# Patient Record
Sex: Male | Born: 1945 | Race: White | Marital: Married
Health system: Southern US, Community
[De-identification: ages and names within clinical notes are randomized; demographics above are authoritative.]

---

## 2011-04-22 ENCOUNTER — Other Ambulatory Visit: Payer: Self-pay | Admitting: Pain Medicine

## 2011-04-22 DIAGNOSIS — M545 Low back pain: Secondary | ICD-10-CM

## 2011-05-08 ENCOUNTER — Ambulatory Visit
Admission: RE | Admit: 2011-05-08 | Discharge: 2011-05-08 | Disposition: A | Discharge status: Home / Self Care | Payer: Medicare Other | Source: Ambulatory Visit | Attending: Pain Medicine | Admitting: Pain Medicine

## 2011-05-08 VITALS — BP 150/74 | HR 62 | Ht 71.0 in | Wt 228.0 lb

## 2011-05-08 DIAGNOSIS — M545 Low back pain, unspecified: Secondary | ICD-10-CM

## 2011-05-08 MED ORDER — MEPERIDINE HCL 100 MG/ML IJ SOLN
75.0000 mg | Freq: Once | INTRAMUSCULAR | Status: AC
  Administered 2011-05-08: 75 mg via INTRAMUSCULAR

## 2011-05-08 MED ORDER — ONDANSETRON HCL 4 MG/2ML IJ SOLN
4.0000 mg | Freq: Once | INTRAMUSCULAR | Status: AC
  Administered 2011-05-08: 4 mg via INTRAMUSCULAR

## 2011-05-08 MED ORDER — IOHEXOL 300 MG/ML  SOLN
10.0000 mL | Freq: Once | INTRAMUSCULAR | Status: AC | PRN
  Administered 2011-05-08: 10 mL via INTRATHECAL

## 2011-05-08 MED ORDER — DIAZEPAM 5 MG PO TABS
5.0000 mg | ORAL_TABLET | Freq: Once | ORAL | Status: AC
  Administered 2011-05-08: 5 mg via ORAL

## 2011-05-08 NOTE — Discharge Instructions (Signed)
Myelogram and Lumbar Puncture Discharge Instructions  1. Go home and rest quietly for the next 24 hours.  It is important to lie flat for the next 24 hours.  Get up only to go to the restroom.  You may lie in the bed or on a couch on your back, your stomach, your left side or your right side.  You may have one pillow under your head.  You may have pillows between your knees while you are on your side or under your knees while you are on your back.  2. DO NOT drive today.  Recline the seat as far back as it will go, while still wearing your seat belt, on the way home.  3. You may get up to go to the bathroom as needed.  You may sit up for 10 minutes to eat.  You may resume your normal diet and medications unless otherwise indicated.  Drink lots of extra fluids today and tomorrow.  4. The incidence of headache, nausea, or vomiting is about 5% (one in 20 patients).  If you develop a headache, lie flat and drink plenty of fluids until the headache goes away.  Caffeinated beverages may be helpful.  If you develop severe nausea and vomiting or a headache that does not go away with flat bed rest, call [phone].  5. You may resume normal activities after your 24 hours of bed rest is over; however, do not exert yourself strongly or do any heavy lifting tomorrow.  6. Call your physician for a follow-up appointment.   7. If you have any questions or if complications develop after you arrive home, please call [phone].  Discharge instructions have been explained to the patient.  The patient, or the person responsible for the patient, fully understands these instructions.

## 2011-05-08 NOTE — Progress Notes (Signed)
Wife at bedside.  Patient resting quietly on back on stretcher without complaint.  Had experienced some nausea after being medicated for pain while rolling over on stretcher or sitting up from procedure table, but states that has passed now.  Denies pain, also.  jkl

## 2012-10-06 IMAGING — CT CT T SPINE W/ CM
2 of 16 series · 7 of 33 positions shown, 8 images · non-contrast
Comparison: Outside MRI.

MYELOGRAM  INJECTION
TECHNIQUE: Informed consent was obtained from the patient prior to
the procedure, including potential complications of headache,
allergy, infection and pain. Specific instructions were given
regarding 24 hour bedrest post procedure to prevent post-LP
headache.  A timeout procedure was performed.  With the patient
prone, the lower back was prepped with Betadine.  1% Lidocaine was
used for local anesthesia.  Lumbar puncture was performed by the
radiologist at the  L3  level using a 22 gauge needle with return
of clear CSF.  10 ml of Umnipaque-QEE was injected into the
subarachnoid space .
CLINICAL DATA: Low back pain.  Mid back pain.  Previous lumbar
fusion using Xstop devices. Subsequent dorsal column stimulator for
failed back syndrome.
TECHNIQUE: Multidetector CT imaging of the lumbar spine was
performed following myelography.  Multiplanar CT image
reconstructions were also generated.
TECHNIQUE: Multidetector CT imaging of the thoracic spine was

[Series 2: t spine bone · axial · 0.35mm/px · z∈[-408,-71]mm · 2 of 136 slices shown, 3 images]
[im 1/136  soft-tissue]
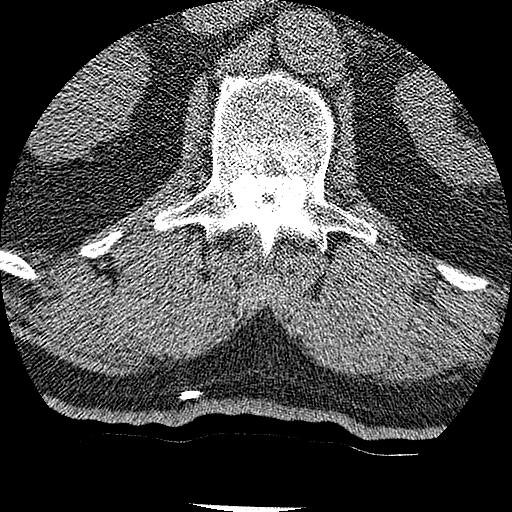
[im 1/136  bone]
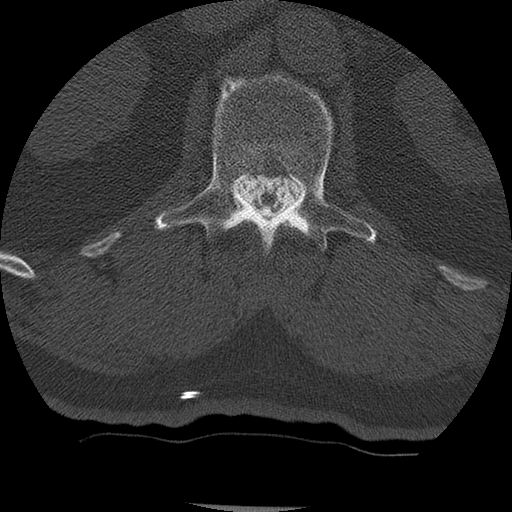
[im 136/136  bone]
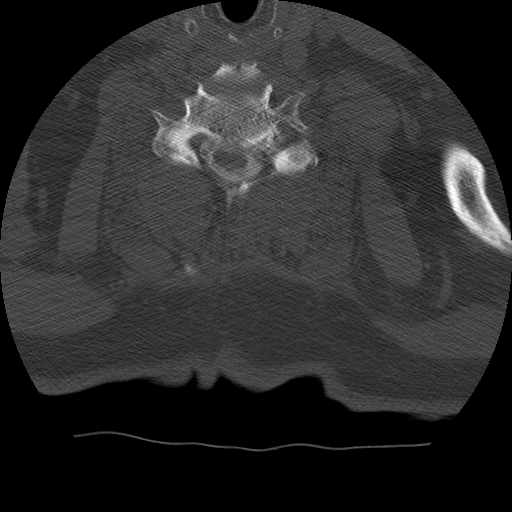

[Series 402: sag t-sp · sagittal · 0.67mm/px · 5 of 56 slices shown]
[im 10/56  bone]
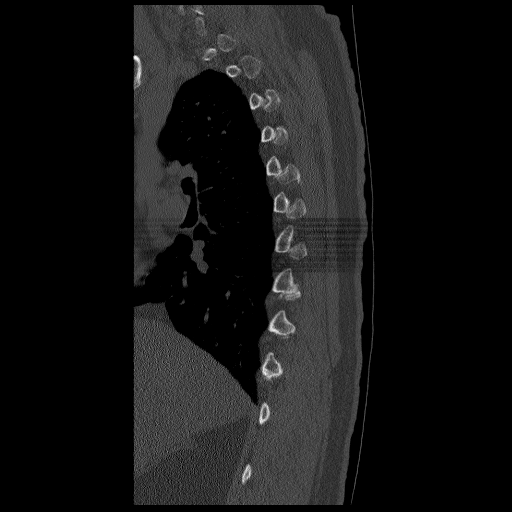
[im 19/56  bone]
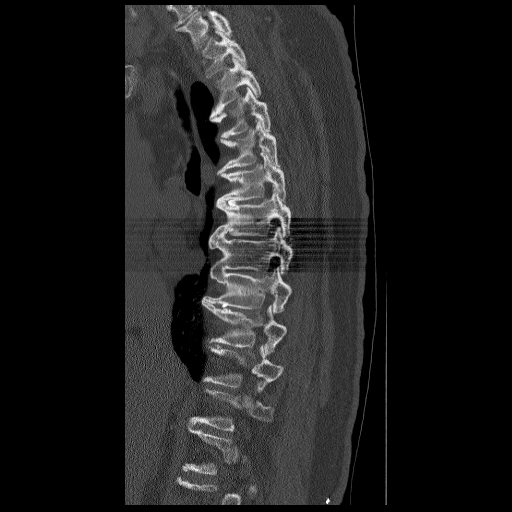
[im 28/56  bone]
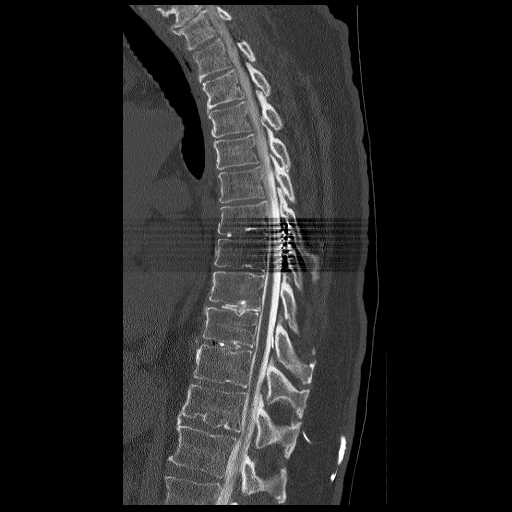
[im 37/56  bone]
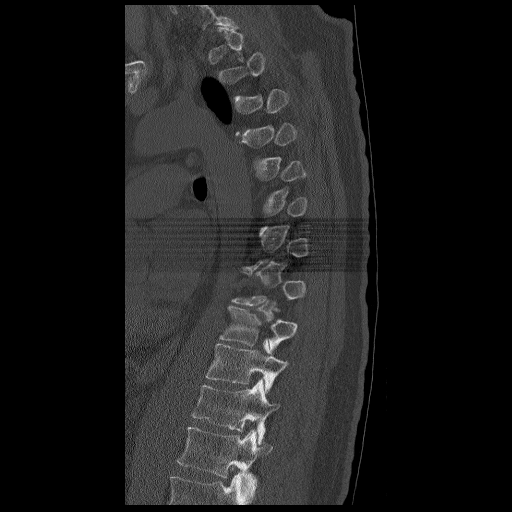
[im 46/56  bone]
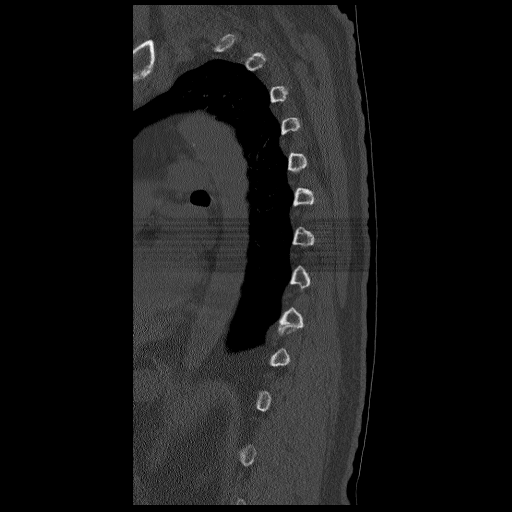

[7 of 33 positions shown; findings below may reference images not displayed]

IMPRESSION: Successful injection of  intrathecal contrast for myelography.

MYELOGRAM LUMBAR
FINDINGS: There is good opacification of the lumbar subarachnoid
space.Prior XStop Devices have been placed across the L3-4 and L4-5
segments. There is advanced disc space narrowing at L5-S1.  There
is severe stenosis at the L2-3, L3-4, L4-5, and L5-S1 levels. With
the patient standing in neutral and extension, there is anatomic
alignment.  In flexion, there is 8 mm anterolisthesis of L4 forward
on L5.  There is no significant movement at L3-4.

Fluoroscopy Time: 2.26 minutes
IMPRESSION: As above

CT MYELOGRAPHY LUMBAR SPINE
FINDINGS: No prevertebral or paraspinous masses.

L1-2:  Mild facet arthropathy.  No stenosis or disc protrusion.

L2-3: Multifactorial spinal stenosis is moderate to severe with
shallow central protrusion, short pedicles, and posterior element
hypertrophy.  Bilateral L3 nerve root encroachment is present,
slightly worse on the right.

L3-4:  The XStop device is malpositioned, with a gap between arms
which extends superiorly and inferiorly on the right with respect
to the spinous processes.  The leftward wings appear to have eroded
the cortex slightly, but are closely applied to the bone.  There is
severe spinal stenosis at this level with short pedicles, posterior
element hypertrophy and central bulging annular fibers affecting
the L4 nerve roots.  Bilateral foraminal narrowing compresses the
exiting L3 nerve roots.

L4-5: The XStop device is malpositioned in a similar fashion as the
one above, with the superior and inferiorly projecting arms on the
right not closely applied to the bone. The inferiorly projecting
left arm appears to have eroded the cortex of the left L5 lamina
slightly.  There is severe spinal stenosis at this level secondary
to short pedicles, central disc protrusion and posterior element
hypertrophy.  Neural foraminal narrowing is seen bilaterally, worse
on the left.  Bilateral L4 and L5 nerve root encroachment are
present.

L5-S1: Severe disc space narrowing.  Central disc protrusion is
partially calcified.  There is advanced facet arthropathy.
Bilateral L5 and S1 nerve root approach are present.
IMPRESSION: Multilevel spondylosis as described.  8 mm dynamic instability at
L4-L5.  Severe multifactorial spinal stenosis at multiple levels
from L2-S1.  Malposition of both XStop devices without secure
attachment to the posterior elements.

MYELOGRAM THORACIC
FINDINGS: Good opacification of the thoracic subarachnoid space.
Dorsal column stimulator enters posteriorly at T9-T10 and extends
upward with the array centered in the dorsal epidural space between
the T6 and T7 vertebral body segments. The wires appear intact, and
the  battery pack is situated in the right paralumbar region.
There is no cord compression, spinal stenosis, or significant
extradural defect.  Scattered Schmorl's nodes can be seen in the
lower thoracic disc spaces.  Anterior bridging osteophytes are
prominent throughout the mid to lower thoracic spine.
IMPRESSION: As above.

CT MYELOGRAPHY THORACIC SPINE
The individual disc spaces are examined as follows:

 T1-2: Normal.

T2-3:  Normal.

T3-4:  Tiny calcific protrusion centrally is non compressive.

T4-5:  Tiny calcific protrusion centrally is non compressive.

T5-6:  Normal.

T6-7:  Tiny central protrusion is non compressive.  Streak artifact
from the dorsal column stimulator. There is no compression of the
underlying cord by the electrodes.

T7-8:  Mild bulge.  Streak artifact. There is no compression of the
underlying cord bony electrodes.

T8-9:  The disc is calcified centrally with Schmorl's node
projecting superiorly and inferiorly.  There is a tiny left
paracentral posterior protrusion which is non compressive.

T9-10:  The disc is calcified centrally with Schmorl's node
formation.  There is a shallow posterior protrusion which is non
compressive. Wires exit posteriorly at this level.

T10-11:  Normal interspace.

T11-12:  Schmorl's nodes.  No posterior disc protrusion.

T12-L1: Mild bulge.
IMPRESSION: Minor thoracic disc pathology without significant disc protrusion
or spinal stenosis.

Dorsal column stimulator appears well positioned with the electrode
array opposite the T6 and T7 vertebral body segments.

## 2021-10-06 ENCOUNTER — Institutional Professional Consult (permissible substitution): Payer: Medicare Other | Admitting: Plastic Surgery

## 2021-10-08 ENCOUNTER — Institutional Professional Consult (permissible substitution): Payer: Medicare Other | Admitting: Plastic Surgery

## 2021-11-13 ENCOUNTER — Institutional Professional Consult (permissible substitution): Payer: Medicare Other | Admitting: Plastic Surgery

## 2021-12-04 ENCOUNTER — Institutional Professional Consult (permissible substitution): Payer: Medicare Other | Admitting: Plastic Surgery

## 2022-04-07 ENCOUNTER — Telehealth: Payer: Self-pay | Admitting: *Deleted

## 2022-04-07 NOTE — Telephone Encounter (Signed)
2nd attempt to contact patient

## 2022-04-07 NOTE — Telephone Encounter (Signed)
Pt has a consult this week on 04/09/22 with Dr. 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 Attempted to notify pt; NA/LVM

## 2022-04-09 ENCOUNTER — Telehealth: Payer: Self-pay | Admitting: Plastic Surgery

## 2022-04-09 ENCOUNTER — Institutional Professional Consult (permissible substitution): Payer: Medicare Other | Admitting: Plastic Surgery

## 2022-04-09 NOTE — Telephone Encounter (Signed)
[facility] called and stated they have made him an apt 05-21-22 at 3pm

## 2022-04-09 NOTE — Telephone Encounter (Signed)
Patient came in for an appointment that we had called and canceled already. We did recommend patient to see [facility]  Dr. Romie Jumper. I provided the patient with the providers name and number to call. I also called [facility] to let them know about the patient and had to leave a vmail. I also called TSAMIS, KATHERINE AGATHA  office, the provider that sent [place] the referral to let them know that neither of our provider could provider treatment for what he was needing and that we gave the patient [facility] number to call. The referring provider office stated if [facility] needs a referral from them that they will send one. Tammi Sou  office has worked with [facility] as well and was ok with that.
